# Patient Record
Sex: Male | Born: 1995 | Race: White | Hispanic: No | Marital: Single | Smoking: Current some day smoker
Health system: Southern US, Community
[De-identification: ages and names within clinical notes are randomized; demographics above are authoritative.]

## PROBLEM LIST (undated history)

## (undated) DIAGNOSIS — F909 Attention-deficit hyperactivity disorder, unspecified type: Secondary | ICD-10-CM

## (undated) DIAGNOSIS — L089 Local infection of the skin and subcutaneous tissue, unspecified: Secondary | ICD-10-CM

## (undated) DIAGNOSIS — Z9109 Other allergy status, other than to drugs and biological substances: Secondary | ICD-10-CM

## (undated) HISTORY — PX: LUNG LOBECTOMY: SHX167

## (undated) HISTORY — PX: OTHER SURGICAL HISTORY: SHX169

## (undated) HISTORY — PX: WISDOM TOOTH EXTRACTION: SHX21

---
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
## 2019-04-27 ENCOUNTER — Encounter (HOSPITAL_COMMUNITY): Payer: Self-pay

## 2019-04-27 ENCOUNTER — Ambulatory Visit (HOSPITAL_COMMUNITY)
Admission: EM | Admit: 2019-04-27 | Discharge: 2019-04-27 | Disposition: A | Discharge status: Home / Self Care | Payer: Medicaid Other | Attending: Family Medicine | Admitting: Family Medicine

## 2019-04-27 ENCOUNTER — Other Ambulatory Visit: Payer: Self-pay

## 2019-04-27 DIAGNOSIS — Z23 Encounter for immunization: Secondary | ICD-10-CM

## 2019-04-27 DIAGNOSIS — W25XXXA Contact with sharp glass, initial encounter: Secondary | ICD-10-CM | POA: Diagnosis not present

## 2019-04-27 DIAGNOSIS — S90852A Superficial foreign body, left foot, initial encounter: Secondary | ICD-10-CM | POA: Diagnosis not present

## 2019-04-27 MED ORDER — TETANUS-DIPHTH-ACELL PERTUSSIS 5-2.5-18.5 LF-MCG/0.5 IM SUSP
0.5000 mL | Freq: Once | INTRAMUSCULAR | Status: AC
  Administered 2019-04-27: 16:00:00 0.5 mL via INTRAMUSCULAR

## 2019-04-27 MED ORDER — TETANUS-DIPHTH-ACELL PERTUSSIS 5-2.5-18.5 LF-MCG/0.5 IM SUSP
INTRAMUSCULAR | Status: AC
  Filled 2019-04-27: qty 0.5

## 2019-04-27 NOTE — ED Triage Notes (Signed)
Patient presents to Urgent Care with complaints of getting glass stuck in his left heel since a few hours ago. Patient reports he tried to dig it out with his fingernails pta, clear object appears to be sticking out of heel area.

## 2019-04-27 NOTE — ED Notes (Signed)
Small piece of glass removed from left hell, gauze bandage with bacitracin placed on bleeding area.

## 2019-04-27 NOTE — Discharge Instructions (Signed)
Watch for any signs of infection.  Return if you get redness pus or increasing pain

## 2019-04-27 NOTE — ED Provider Notes (Signed)
[facility]    CSN: [id number] Arrival date & time: 04/27/19  1437      History   Chief Complaint Chief Complaint  Patient presents with  . Foreign Body in Foot    Glass    HPI Lissa HoardBradley S Jurek is a 23 y.o. male.   HPI patient stepped on a piece of glass at home.  He cannot get it out.  Bleeding.  He is here to have the foreign body removed  History reviewed. No pertinent past medical history.  There are no active problems to display for this patient.   Past Surgical History:  Procedure Laterality Date  . cyst removed from left lung    . LUNG LOBECTOMY     half of left lung removed       Home Medications    Prior to Admission medications   Not on File    Family History Family History  Problem Relation Age of Onset  . Healthy Mother   . Healthy Father     Social History Social History   Tobacco Use  . Smoking status: Current Some Day Smoker    Types: Cigars  . Smokeless tobacco: Never Used  Substance Use Topics  . Alcohol use: Yes    Alcohol/week: 4.0 standard drinks    Types: 4 Cans of beer per week  . Drug use: Yes    Types: Marijuana     Allergies   Ceftin   Review of Systems Review of Systems  Constitutional: Negative for chills and fever.  HENT: Negative for ear pain and sore throat.   Eyes: Negative for pain and visual disturbance.  Respiratory: Negative for cough and shortness of breath.   Cardiovascular: Negative for chest pain and palpitations.  Gastrointestinal: Negative for abdominal pain and vomiting.  Genitourinary: Negative for dysuria and hematuria.  Musculoskeletal: Negative for arthralgias and back pain.  Skin: Positive for wound. Negative for color change and rash.  Neurological: Negative for seizures and syncope.  All other systems reviewed and are negative.    Physical Exam Triage Vital Signs ED Triage Vitals  Enc Vitals Group     BP 04/27/19 1526 119/76     Pulse Rate 04/27/19 1526 95     Resp  04/27/19 1526 15     Temp 04/27/19 1526 97.8 F (36.6 C)     Temp Source 04/27/19 1526 Oral     SpO2 04/27/19 1526 99 %     Weight --      Height --      Head Circumference --      Peak Flow --      Pain Score 04/27/19 1525 0     Pain Loc --      Pain Edu? --      Excl. in GC? --    No data found.  Updated Vital Signs BP 119/76 (BP Location: Left Arm)   Pulse 95   Temp 97.8 F (36.6 C) (Oral)   Resp 15   SpO2 99%      Physical Exam Constitutional:      General: He is not in acute distress.    Appearance: He is well-developed.  HENT:     Head: Normocephalic and atraumatic.  Eyes:     Conjunctiva/sclera: Conjunctivae normal.     Pupils: Pupils are equal, round, and reactive to light.  Neck:     Musculoskeletal: Normal range of motion.  Cardiovascular:     Rate and Rhythm: Normal rate.  Pulmonary:     Effort: Pulmonary effort is normal. No respiratory distress.  Abdominal:     General: There is no distension.     Palpations: Abdomen is soft.  Musculoskeletal: Normal range of motion.  Skin:    General: Skin is warm and dry.     Comments: The bottom of the left heel there is a linear laceration right through the heel that is about 3 mm long.  The area is cleaned with soap and water.  Cleaned again with alcohol.  With splinter forceps the glass was removed.  Band-Aid is placed.  Wound care discussed  Neurological:     Mental Status: He is alert.      UC Treatments / Results  Labs (all labs ordered are listed, but only abnormal results are displayed) Labs Reviewed - No data to display  EKG   Radiology No results found.  Procedures Procedures (including critical care time)  Medications Ordered in UC Medications  Tdap (BOOSTRIX) injection 0.5 mL (0.5 mLs Intramuscular Given 04/27/19 1534)  Tdap (BOOSTRIX) 5-2.5-18.5 LF-MCG/0.5 injection (has no administration in time range)    Initial Impression / Assessment and Plan / UC Course  I have reviewed the  triage vital signs and the nursing notes.  Pertinent labs & imaging results that were available during my care of the patient were reviewed by me and considered in my medical decision making (see chart for details).      Final Clinical Impressions(s) / UC Diagnoses   Final diagnoses:  Foreign body in left foot, initial encounter     Discharge Instructions     Watch for any signs of infection.  Return if you get redness pus or increasing pain   ED Prescriptions    None     Controlled Substance Prescriptions [place] Controlled Substance Registry consulted? Not Applicable   Raylene Everts, MD 04/27/19 1547

## 2019-09-23 ENCOUNTER — Other Ambulatory Visit: Payer: Self-pay

## 2019-09-23 ENCOUNTER — Encounter (HOSPITAL_COMMUNITY): Payer: Self-pay | Admitting: Emergency Medicine

## 2019-09-23 ENCOUNTER — Ambulatory Visit (HOSPITAL_COMMUNITY)
Admission: EM | Admit: 2019-09-23 | Discharge: 2019-09-23 | Disposition: A | Discharge status: Home / Self Care | Payer: Medicaid Other | Attending: Emergency Medicine | Admitting: Emergency Medicine

## 2019-09-23 DIAGNOSIS — L089 Local infection of the skin and subcutaneous tissue, unspecified: Secondary | ICD-10-CM

## 2019-09-23 DIAGNOSIS — L729 Follicular cyst of the skin and subcutaneous tissue, unspecified: Secondary | ICD-10-CM | POA: Diagnosis not present

## 2019-09-23 MED ORDER — DOXYCYCLINE HYCLATE 100 MG PO CAPS: 100.0000 mg | ORAL_CAPSULE | Freq: Two times a day (BID) | ORAL | 0 refills | Status: DC

## 2019-09-23 MED ORDER — IBUPROFEN 600 MG PO TABS: 600.0000 mg | ORAL_TABLET | Freq: Four times a day (QID) | ORAL | 0 refills | Status: DC | PRN

## 2019-09-23 NOTE — Discharge Instructions (Signed)
Begin doxycycline twice daily for the next 10 days Use anti-inflammatories for pain/swelling. You may take up to 800 mg Ibuprofen every 8 hours with food. You may supplement Ibuprofen with Tylenol [phone] mg every 8 hours.  Warm compresses to area on ear  If becoming recurrent please follow-up with dermatology for further evaluation and discussion of possible removal of underlying cysts

## 2019-09-23 NOTE — ED Provider Notes (Signed)
[facility]    CSN: [id number] Arrival date & time: 09/23/19  1036      History   Chief Complaint Chief Complaint  Patient presents with  . Otalgia    HPI Kristopher Mccall is a 24 y.o. male no significant past medical history presenting today for evaluation of ear pain.  Patient states that over the past week he has had increased pain and swelling to the base of his right ear.  Has had similar symptoms on left, but more mild.  He notes that he has had a nervous tic to pull at his earlobes and he believes this may have triggered his issues.  Has felt a muffled sensation in his right ear.  Denies associated URI symptoms of congestion cough or sore throat.  Denies fevers.  HPI  History reviewed. No pertinent past medical history.  There are no problems to display for this patient.   Past Surgical History:  Procedure Laterality Date  . cyst removed from left lung    . LUNG LOBECTOMY     half of left lung removed       Home Medications    Prior to Admission medications   Medication Sig Start Date End Date Taking? Authorizing Provider  doxycycline (VIBRAMYCIN) 100 MG capsule Take 1 capsule (100 mg total) by mouth 2 (two) times daily for 10 days. 09/23/19 10/03/19  Gurleen Larrivee C, PA-C  ibuprofen (ADVIL) 600 MG tablet Take 1 tablet (600 mg total) by mouth every 6 (six) hours as needed. 09/23/19   Ziah Turvey, Junius Creamer, PA-C    Family History Family History  Problem Relation Age of Onset  . Healthy Mother   . Healthy Father     Social History Social History   Tobacco Use  . Smoking status: Current Some Day Smoker    Types: Cigars  . Smokeless tobacco: Never Used  Substance Use Topics  . Alcohol use: Yes    Alcohol/week: 4.0 standard drinks    Types: 4 Cans of beer per week  . Drug use: Yes    Types: Marijuana     Allergies   Ceftin   Review of Systems Review of Systems  Constitutional: Negative for activity change, appetite change, chills,  fatigue and fever.  HENT: Positive for ear pain. Negative for congestion, rhinorrhea, sinus pressure, sore throat and trouble swallowing.   Eyes: Negative for discharge and redness.  Respiratory: Negative for cough, chest tightness and shortness of breath.   Cardiovascular: Negative for chest pain.  Gastrointestinal: Negative for abdominal pain, diarrhea, nausea and vomiting.  Musculoskeletal: Negative for myalgias.  Skin: Negative for rash.  Neurological: Negative for dizziness, light-headedness and headaches.     Physical Exam Triage Vital Signs ED Triage Vitals [09/23/19 1055]  Enc Vitals Group     BP 128/80     Pulse Rate 99     Resp 18     Temp 98.3 F (36.8 C)     Temp Source Oral     SpO2 96 %     Weight      Height      Head Circumference      Peak Flow      Pain Score 5     Pain Loc      Pain Edu?      Excl. in GC?    No data found.  Updated Vital Signs BP 128/80 (BP Location: Right Arm)   Pulse 99   Temp 98.3 F (36.8 C) (  Oral)   Resp 18   SpO2 96%   Visual Acuity Right Eye Distance:   Left Eye Distance:   Bilateral Distance:    Right Eye Near:   Left Eye Near:    Bilateral Near:     Physical Exam Vitals and nursing note reviewed.  Constitutional:      Appearance: He is well-developed.     Comments: No acute distress  HENT:     Head: Normocephalic and atraumatic.     Ears:     Comments: Right ear: base of auricle/lobe with erythema and induration, tender to palpation, 2 dark punctate spots anteriorly and posteriorly ; questionable post auricular lymphadenopathy vs cyst related  Left external ear with palpable ball within lobe/ tissue connecting to head  Bilateral Canal normal, TM with good bony landmarks and cone of light    Nose: Nose normal.  Eyes:     Conjunctiva/sclera: Conjunctivae normal.  Cardiovascular:     Rate and Rhythm: Normal rate.  Pulmonary:     Effort: Pulmonary effort is normal. No respiratory distress.  Abdominal:      General: There is no distension.  Musculoskeletal:        General: Normal range of motion.     Cervical back: Neck supple.  Skin:    General: Skin is warm and dry.  Neurological:     Mental Status: He is alert and oriented to person, place, and time.      UC Treatments / Results  Labs (all labs ordered are listed, but only abnormal results are displayed) Labs Reviewed - No data to display  EKG   Radiology No results found.  Procedures Procedures (including critical care time)  Medications Ordered in UC Medications - No data to display  Initial Impression / Assessment and Plan / UC Course  I have reviewed the triage vital signs and the nursing notes.  Pertinent labs & imaging results that were available during my care of the patient were reviewed by me and considered in my medical decision making (see chart for details).     Appears to have multiple cysts to bilateral external ears, right appears inflamed/infected will initiate on doxycycline not take anti-inflammatories, apply warm compresses.  Given location and multiple cysts recommending follow-up with dermatology for possible removal.  No otitis media/externa currently.  Discussed strict return precautions. Patient verbalized understanding and is agreeable with plan.  Final Clinical Impressions(s) / UC Diagnoses   Final diagnoses:  Infected cyst of skin     Discharge Instructions     Begin doxycycline twice daily for the next 10 days Use anti-inflammatories for pain/swelling. You may take up to 800 mg Ibuprofen every 8 hours with food. You may supplement Ibuprofen with Tylenol [phone] mg every 8 hours.  Warm compresses to area on ear  If becoming recurrent please follow-up with dermatology for further evaluation and discussion of possible removal of underlying cysts   ED Prescriptions    Medication Sig Dispense Auth. Provider   doxycycline (VIBRAMYCIN) 100 MG capsule Take 1 capsule (100 mg total) by  mouth 2 (two) times daily for 10 days. 20 capsule Analina Filla C, PA-C   ibuprofen (ADVIL) 600 MG tablet Take 1 tablet (600 mg total) by mouth every 6 (six) hours as needed. 30 tablet Lonzo Saulter, Homer C, PA-C     PDMP not reviewed this encounter.   Janith Lima, [place] 09/23/19 1129

## 2019-09-23 NOTE — ED Triage Notes (Signed)
Pt here for left ear pain behind left ear

## 2019-09-29 ENCOUNTER — Other Ambulatory Visit: Payer: Self-pay

## 2019-09-29 ENCOUNTER — Ambulatory Visit (HOSPITAL_COMMUNITY)
Admission: EM | Admit: 2019-09-29 | Discharge: 2019-09-29 | Disposition: A | Discharge status: Home / Self Care | Payer: Medicaid Other | Attending: Emergency Medicine | Admitting: Emergency Medicine

## 2019-09-29 ENCOUNTER — Encounter (HOSPITAL_COMMUNITY): Payer: Self-pay

## 2019-09-29 DIAGNOSIS — L089 Local infection of the skin and subcutaneous tissue, unspecified: Secondary | ICD-10-CM | POA: Insufficient documentation

## 2019-09-29 DIAGNOSIS — L723 Sebaceous cyst: Secondary | ICD-10-CM | POA: Insufficient documentation

## 2019-09-29 MED ORDER — DOXYCYCLINE HYCLATE 100 MG PO CAPS: 100.0000 mg | ORAL_CAPSULE | Freq: Two times a day (BID) | ORAL | 0 refills | Status: AC

## 2019-09-29 MED ORDER — DOXYCYCLINE HYCLATE 100 MG PO CAPS: 100.0000 mg | ORAL_CAPSULE | Freq: Two times a day (BID) | ORAL | 0 refills | Status: DC

## 2019-09-29 NOTE — Discharge Instructions (Addendum)
continue ibuprofen, discontinue ice, start warm compresses.  5 more days of doxycycline.  You will need to follow-up with dermatology to have these definitively removed otherwise they will keep coming back.  Keep the Band-Aid on for several days.  Return if things seem to be getting worse.  Otherwise follow-up with one of the dermatologist listed above, follow-up with a primary care provider of your choice for routine care see list below Below is a list of primary care practices who are taking new patients for you to follow-up with.  [facility] internal medicine clinic Ground Floor - [facility], [address] [place], [place], [place] [address] [phone]  [facility] at [facility] [address] [place], [place] [address] [phone]  [facility] [address] [place], [place] [address] [phone]  [facility] [phone] [address] [place] [address]  [facility]: [address] [place] [address]  [phone]  [facility]: [address] [place] [address]   [phone]  [facility]: [address] [place] [address]  [phone]  [facility] : [address] [place] [address] [phone]  [facility]: [address] [place] [address] [phone]  [facility]: [address] [place] [address] [phone]  Dr. Oneal Grout [address] [facility] [place] [address]  [phone]  Dr. Jackie Plum, [facility]. [address] [place], [place] [address]  [phone]  Go to www.goodrx.com to look up your medications. This will give you a list of where you can find your  prescriptions at the most affordable prices. Or ask the pharmacist what the cash price is, or if they have any other discount programs available to help make your medication more affordable. This can be less expensive than what you would pay with insurance.  We will call you or reach out to you via MyChart if we need to change your antibiotics.  Your culture will take several days to come back.

## 2019-09-29 NOTE — ED Triage Notes (Signed)
Pt state he has a ear discomfort behind his ear lobes. (abscess ). Pt state this has been getting worst for the last month.

## 2019-09-29 NOTE — ED Provider Notes (Signed)
HPI  SUBJECTIVE:  Kristopher Mccall is a 24 y.o. male who presents with 2 to 3 days of a  painful erythematous mass of gradually increasing size inferior to his left earlobe.  He describes the pain as throbbing, present only with head movement and eating.  It lasts seconds.  He denies fevers, otorrhea, ear pain, dental pain.  States that his ears feel "clogged".  Patient was seen here 1/28, thought to have an infected cyst behind his right ear started on doxycycline and recommended to follow-up with dermatology.  States that he is taking the doxycycline mostly as prescribed and is taking 600 mg of ibuprofen once or twice a day.  He has also been applying ice which seems to help with the pain.  States that the right ear has started draining dark purulent material and the pain has resolved.  Primary concern today is left ear.  Symptoms are worse with head movement, eating.  He took ibuprofen within 4 to 6 hours of evaluation.  He has had sebaceous cysts in these areas before.  No contacts with MRSA.  No recent trauma.  Patient denies "messing with my ears" before the symptoms started.  No history of MRSA, diabetes.  PMD: None.  History reviewed. No pertinent past medical history.  Past Surgical History:  Procedure Laterality Date  . cyst removed from left lung    . LUNG LOBECTOMY     half of left lung removed    Family History  Problem Relation Age of Onset  . Healthy Mother   . Healthy Father     Social History   Tobacco Use  . Smoking status: Current Some Day Smoker    Types: Cigars  . Smokeless tobacco: Never Used  Substance Use Topics  . Alcohol use: Yes    Alcohol/week: 4.0 standard drinks    Types: 4 Cans of beer per week  . Drug use: Yes    Types: Marijuana    No current facility-administered medications for this encounter.  Current Outpatient Medications:  .  doxycycline (VIBRAMYCIN) 100 MG capsule, Take 1 capsule (100 mg total) by mouth 2 (two) times daily for 5 days.,  Disp: 10 capsule, Rfl: 0 .  ibuprofen (ADVIL) 600 MG tablet, Take 1 tablet (600 mg total) by mouth every 6 (six) hours as needed., Disp: 30 tablet, Rfl: 0  Allergies  Allergen Reactions  . Ceftin Diarrhea    Upset stomach     ROS  As noted in HPI.   Physical Exam  BP 126/89 (BP Location: Right Arm)   Pulse 85   Temp 98 F (36.7 C) (Oral)   Resp 16   Wt 70.3 kg   SpO2 98%   Constitutional: Well developed, well nourished, no acute distress Eyes:  EOMI, conjunctiva normal bilaterally HENT: Normocephalic, atraumatic,mucus membranes moist Left ear: Tender 1 x 1 cm area of erythema without induration inferior to the lobe.  No swelling behind the ear.  No tenderness of the mastoid.  Left external ear, external ear canal, TM normal.   Right ear: 2 x 1 cm nontender area of erythema with central fluctuance posterior inferior earlobe .  No expressible purulent drainage.  Positive comedone.  No mastoid tenderness.  External ear, external ear canal, TM normal.     Lymph: No surrounding lymphadenopathy bilaterally.  Respiratory: Normal inspiratory effort Cardiovascular: Normal rate GI: nondistended skin: No rash, skin intact Musculoskeletal: no deformities Neurologic: Alert & oriented x 3, no focal neuro deficits Psychiatric: Speech  and behavior appropriate   ED Course   Medications - No data to display  Orders Placed This Encounter  Procedures  . Aerobic Culture (superficial specimen)    Posterior right earlobe.    Standing Status:   Standing    Number of Occurrences:   1    No results found for this or any previous visit (from the past 24 hour(s)). No results found.  ED Clinical Impression  1. Infected sebaceous cyst      ED Assessment/Plan  Previous labs reviewed.  As noted in HPI.  Presentation consistent with infected sebaceous cyst.  Advised patient that he will need to have these surgically removed by dermatology for definitive treatment.  Given the  large area of fluctuance on the right side, will I&D this and send off for culture to ensure the correct antibiotic treatment.  He is to continue ibuprofen, discontinue ice, start warm compresses and will send home with 5 more days of doxycycline.  Advised that he will need to follow-up with dermatology, will give information for the local dermatology practices.  Will also give primary care referral list for routine care.  Procedure note: Cleaned area with alcohol.  Use a sterile 18-gauge needle make a single stab incision in the posterior right ear mass.  Expressed  copious amounts of purulent bloody material.  Sent this off for culture.  Applied bacitracin and a Band-Aid.  Patient tolerated procedure well.  Discussed labs,  MDM, treatment plan, and plan for follow-up with patient. Discussed sn/sx that should prompt return to the ED. patient agrees with plan.   Meds ordered this encounter  Medications  . DISCONTD: doxycycline (VIBRAMYCIN) 100 MG capsule    Sig: Take 1 capsule (100 mg total) by mouth 2 (two) times daily for 5 days.    Dispense:  20 capsule    Refill:  0  . doxycycline (VIBRAMYCIN) 100 MG capsule    Sig: Take 1 capsule (100 mg total) by mouth 2 (two) times daily for 5 days.    Dispense:  10 capsule    Refill:  0    *This clinic note was created using [employer]. Therefore, there may be occasional mistakes despite careful proofreading.   ?   Melynda Ripple, MD 09/29/19 [phone]

## 2019-10-02 LAB — AEROBIC CULTURE W GRAM STAIN (SUPERFICIAL SPECIMEN)

## 2019-10-02 LAB — AEROBIC CULTURE? (SUPERFICIAL SPECIMEN): Culture: NORMAL

## 2020-05-01 ENCOUNTER — Emergency Department (HOSPITAL_COMMUNITY): Payer: Medicaid Other

## 2020-05-01 ENCOUNTER — Emergency Department (HOSPITAL_COMMUNITY)
Admission: EM | Admit: 2020-05-01 | Discharge: 2020-05-01 | Disposition: A | Discharge status: Home / Self Care | Payer: Medicaid Other | Attending: Emergency Medicine | Admitting: Emergency Medicine

## 2020-05-01 DIAGNOSIS — Y939 Activity, unspecified: Secondary | ICD-10-CM | POA: Diagnosis not present

## 2020-05-01 DIAGNOSIS — Y92410 Unspecified street and highway as the place of occurrence of the external cause: Secondary | ICD-10-CM | POA: Diagnosis not present

## 2020-05-01 DIAGNOSIS — Z5321 Procedure and treatment not carried out due to patient leaving prior to being seen by health care provider: Secondary | ICD-10-CM | POA: Diagnosis not present

## 2020-05-01 DIAGNOSIS — M25551 Pain in right hip: Secondary | ICD-10-CM | POA: Diagnosis not present

## 2020-05-01 DIAGNOSIS — R519 Headache, unspecified: Secondary | ICD-10-CM | POA: Insufficient documentation

## 2020-05-01 DIAGNOSIS — M79661 Pain in right lower leg: Secondary | ICD-10-CM | POA: Insufficient documentation

## 2020-05-01 DIAGNOSIS — Y999 Unspecified external cause status: Secondary | ICD-10-CM | POA: Diagnosis not present

## 2020-05-01 DIAGNOSIS — M25511 Pain in right shoulder: Secondary | ICD-10-CM | POA: Diagnosis present

## 2020-05-01 NOTE — ED Notes (Signed)
Called pt no answer X3 

## 2020-05-01 NOTE — ED Notes (Signed)
Pt didn't answer for vital recheck
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
## 2020-05-01 NOTE — ED Notes (Signed)
Called pt for vitals no answer X2 

## 2020-05-02 ENCOUNTER — Ambulatory Visit (INDEPENDENT_AMBULATORY_CARE_PROVIDER_SITE_OTHER): Payer: Medicaid Other

## 2020-05-02 ENCOUNTER — Encounter (HOSPITAL_COMMUNITY): Payer: Self-pay

## 2020-05-02 ENCOUNTER — Other Ambulatory Visit: Payer: Self-pay

## 2020-05-02 ENCOUNTER — Ambulatory Visit (HOSPITAL_COMMUNITY)
Admission: EM | Admit: 2020-05-02 | Discharge: 2020-05-02 | Disposition: A | Discharge status: Home / Self Care | Payer: Medicaid Other | Attending: Physician Assistant | Admitting: Physician Assistant

## 2020-05-02 DIAGNOSIS — R0781 Pleurodynia: Secondary | ICD-10-CM | POA: Diagnosis not present

## 2020-05-02 DIAGNOSIS — M79661 Pain in right lower leg: Secondary | ICD-10-CM

## 2020-05-02 DIAGNOSIS — S20211A Contusion of right front wall of thorax, initial encounter: Secondary | ICD-10-CM

## 2020-05-02 DIAGNOSIS — M25571 Pain in right ankle and joints of right foot: Secondary | ICD-10-CM | POA: Diagnosis not present

## 2020-05-02 DIAGNOSIS — M25511 Pain in right shoulder: Secondary | ICD-10-CM | POA: Diagnosis not present

## 2020-05-02 DIAGNOSIS — M7918 Myalgia, other site: Secondary | ICD-10-CM

## 2020-05-02 DIAGNOSIS — M79671 Pain in right foot: Secondary | ICD-10-CM

## 2020-05-02 MED ORDER — IBUPROFEN 600 MG PO TABS: 600.0000 mg | ORAL_TABLET | Freq: Four times a day (QID) | ORAL | 0 refills | Status: AC | PRN

## 2020-05-02 MED ORDER — ACETAMINOPHEN 500 MG PO TABS: 1000.0000 mg | ORAL_TABLET | Freq: Three times a day (TID) | ORAL | 0 refills | Status: AC | PRN
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
## 2021-10-11 ENCOUNTER — Encounter (HOSPITAL_COMMUNITY): Payer: Self-pay

## 2021-10-11 ENCOUNTER — Ambulatory Visit (HOSPITAL_COMMUNITY)
Admission: EM | Admit: 2021-10-11 | Discharge: 2021-10-11 | Disposition: A | Discharge status: Home / Self Care | Payer: Medicaid Other | Attending: Family Medicine | Admitting: Family Medicine

## 2021-10-11 DIAGNOSIS — L723 Sebaceous cyst: Secondary | ICD-10-CM | POA: Diagnosis not present

## 2021-10-11 DIAGNOSIS — L089 Local infection of the skin and subcutaneous tissue, unspecified: Secondary | ICD-10-CM

## 2021-10-11 MED ORDER — DOXYCYCLINE HYCLATE 100 MG PO CAPS: 100.0000 mg | ORAL_CAPSULE | Freq: Two times a day (BID) | ORAL | 0 refills | Status: DC

## 2021-10-11 MED ORDER — HYDROCODONE-ACETAMINOPHEN 5-325 MG PO TABS
ORAL_TABLET | ORAL | Status: AC
  Filled 2021-10-11: qty 1

## 2021-10-11 MED ORDER — HYDROCODONE-ACETAMINOPHEN 5-325 MG PO TABS
1.0000 | ORAL_TABLET | Freq: Once | ORAL | Status: AC
  Administered 2021-10-11: 1 via ORAL

## 2021-10-11 NOTE — ED Triage Notes (Signed)
Pt presents with c/o R ear lobe pain.  Presents with swelling and redness.   States it has been swollen x 3 days.

## 2021-10-16 NOTE — ED Provider Notes (Signed)
[facility]   [id number] 10/11/21 Arrival Time: 1738  ASSESSMENT & PLAN:  1. Infected sebaceous cyst of skin     Incision and Drainage Procedure Note  Anesthesia: PainEaze topical spray  Procedure Details  The procedure, risks and complications have been discussed in detail (including, but not limited to pain and bleeding) with the patient.  The skin induration was prepped and draped in the usual fashion. After adequate local anesthesia, I&D with a #11 blade was performed on the right posterior earlobe with copious, purulent drainage.  EBL: minimal Drains: none Packing: n/a Condition: Tolerated procedure well Complications: none.  Meds ordered this encounter  Medications   HYDROcodone-acetaminophen (NORCO/VICODIN) 5-325 MG per tablet 1 tablet   doxycycline (VIBRAMYCIN) 100 MG capsule    Sig: Take 1 capsule (100 mg total) by mouth 2 (two) times daily.    Dispense:  14 capsule    Refill:  0   Wound care instructions discussed and given in written format. To return in 48 hours for wound check if he feels the need.  Finish all antibiotics. OTC analgesics as needed.  Reviewed expectations re: course of current medical issues. Questions answered. Outlined signs and symptoms indicating need for more acute intervention. Patient verbalized understanding. After Visit Summary given.   SUBJECTIVE:  Kristopher Mccall is a 26 y.o. male who presents with a possible infection of his R earlobe. Onset gradual, approximately a few days ago without active drainage and without active bleeding. Symptoms have gradually worsened since beginning; more painful. Fever: absent. OTC/home treatment: none.   OBJECTIVE:  Vitals:   10/11/21 1858  BP: 113/74  Pulse: 61  Resp: 17  Temp: 98.3 F (36.8 C)  TempSrc: Oral  SpO2: 95%     General appearance: alert; no distress R earlobe: approx 1.5 - 2 cm area of induration; tender to touch; no active drainage or  bleeding Psychological: alert and cooperative; normal mood and affect  Allergies  Allergen Reactions   Ceftin Diarrhea    Upset stomach    History reviewed. No pertinent past medical history. Social History   Socioeconomic History   Marital status: Single    Spouse name: Not on file   Number of children: Not on file   Years of education: Not on file   Highest education level: Not on file  Occupational History   Not on file  Tobacco Use   Smoking status: Some Days    Types: Cigars   Smokeless tobacco: Never  Vaping Use   Vaping Use: Never used  Substance and Sexual Activity   Alcohol use: Yes    Alcohol/week: 4.0 standard drinks    Types: 4 Cans of beer per week   Drug use: Yes    Types: Marijuana   Sexual activity: Yes  Other Topics Concern   Not on file  Social History Narrative   Not on file   Social Determinants of Health   Financial Resource Strain: Not on file  Food Insecurity: Not on file  Transportation Needs: Not on file  Physical Activity: Not on file  Stress: Not on file  Social Connections: Not on file   Family History  Problem Relation Age of Onset   Healthy Mother    Healthy Father    Past Surgical History:  Procedure Laterality Date   cyst removed from left lung     LUNG LOBECTOMY     half of left lung removed  Mardella Layman, MD 10/16/21 [phone]

## 2021-12-16 ENCOUNTER — Encounter (HOSPITAL_COMMUNITY): Payer: Self-pay

## 2021-12-16 ENCOUNTER — Ambulatory Visit (HOSPITAL_COMMUNITY)
Admission: EM | Admit: 2021-12-16 | Discharge: 2021-12-16 | Disposition: A | Discharge status: Home / Self Care | Payer: Medicaid Other | Attending: Internal Medicine | Admitting: Internal Medicine

## 2021-12-16 DIAGNOSIS — K047 Periapical abscess without sinus: Secondary | ICD-10-CM | POA: Diagnosis not present

## 2021-12-16 DIAGNOSIS — K0889 Other specified disorders of teeth and supporting structures: Secondary | ICD-10-CM

## 2021-12-16 MED ORDER — AMOXICILLIN-POT CLAVULANATE 875-125 MG PO TABS: 1.0000 | ORAL_TABLET | Freq: Two times a day (BID) | ORAL | 0 refills | Status: DC

## 2021-12-16 MED ORDER — LIDOCAINE VISCOUS HCL 2 % MT SOLN: 15.0000 mL | OROMUCOSAL | 0 refills | Status: DC | PRN

## 2021-12-16 NOTE — Discharge Instructions (Signed)
You have been prescribed an antibiotic and a lidocaine solution to help alleviate symptoms.  Please follow-up with dentist for further evaluation and management. ?

## 2021-12-16 NOTE — ED Triage Notes (Signed)
2-3 days of numbness and tingling with some facial swelling. Pt believes that he may have a few infected teeth. Dental pain is intermittent and mostly occurs when he bites down hard. N/t >pain. ?Has been taking otc pain meds with temporary relief. ?

## 2021-12-16 NOTE — ED Provider Notes (Signed)
?[facility] ? ? ? ?CSN: [id number] ?Arrival date & time: 12/16/21  1408 ? ? ?  ? ?History   ?Chief Complaint ?Chief Complaint  ?Patient presents with  ? Dental Pain  ? ? ?HPI ?Kristopher Mccall is a 26 y.o. male.  ? ?Patient presents with 2 to 3-day history of dental pain, swelling, tingling.  Patient reports that he has a history of bad dentition, and he saw a dentist approximately 1 week ago.  He only had a dental evaluation as his insurance would not cover for any procedures, extractions, medications, treatment.  Pain is mainly present in the right upper back teeth but is present throughout as well.  Patient denies any antibiotics were prescribed by dentist.  Pain mainly occurs with movement of the mouth and with eating.  Patient has been taking over-the-counter pain medication with minimal improvement. ? ? ?Dental Pain ? ?History reviewed. No pertinent past medical history. ? ?There are no problems to display for this patient. ? ? ?Past Surgical History:  ?Procedure Laterality Date  ? cyst removed from left lung    ? LUNG LOBECTOMY    ? half of left lung removed  ? ? ? ? ? ?Home Medications   ? ?Prior to Admission medications   ?Medication Sig Start Date End Date Taking? Authorizing Provider  ?amoxicillin-clavulanate (AUGMENTIN) 875-125 MG tablet Take 1 tablet by mouth every 12 (twelve) hours. 12/16/21  Yes Gustavus Bryant, FNP  ?lidocaine (XYLOCAINE) 2 % solution Use as directed 15 mLs in the mouth or throat as needed for mouth pain. Swish and spit 12/16/21  Yes Gustavus Bryant, FNP  ?acetaminophen (TYLENOL) 500 MG tablet Take 2 tablets (1,000 mg total) by mouth every 8 (eight) hours as needed. 05/02/20   Darr, Gerilyn Pilgrim, PA-C  ?doxycycline (VIBRAMYCIN) 100 MG capsule Take 1 capsule (100 mg total) by mouth 2 (two) times daily. 10/11/21   Mardella Layman, MD  ?ibuprofen (ADVIL) 600 MG tablet Take 1 tablet (600 mg total) by mouth every 6 (six) hours as needed. 05/02/20   Darr, Gerilyn Pilgrim, PA-C  ? ? ?Family History ?Family  History  ?Problem Relation Age of Onset  ? Healthy Mother   ? Healthy Father   ? ? ?Social History ?Social History  ? ?Tobacco Use  ? Smoking status: Some Days  ?  Types: Cigars  ? Smokeless tobacco: Never  ?Vaping Use  ? Vaping Use: Never used  ?Substance Use Topics  ? Alcohol use: Yes  ?  Alcohol/week: 4.0 standard drinks  ?  Types: 4 Cans of beer per week  ? Drug use: Yes  ?  Types: Marijuana  ? ? ? ?Allergies   ?Ceftin ? ? ?Review of Systems ?Review of Systems ?Per HPI ? ?Physical Exam ?Triage Vital Signs ?ED Triage Vitals  ?Enc Vitals Group  ?   BP 12/16/21 1509 111/72  ?   Pulse Rate 12/16/21 1509 68  ?   Resp 12/16/21 1509 18  ?   Temp 12/16/21 1509 98.2 ?F (36.8 ?C)  ?   Temp Source 12/16/21 1509 Oral  ?   SpO2 12/16/21 1509 98 %  ?   Weight --   ?   Height --   ?   Head Circumference --   ?   Peak Flow --   ?   Pain Score 12/16/21 1508 0  ?   Pain Loc --   ?   Pain Edu? --   ?   Excl. in GC? --   ? ?  No data found. ? ?Updated Vital Signs ?BP 111/72 (BP Location: Right Arm)   Pulse 68   Temp 98.2 ?F (36.8 ?C) (Oral)   Resp 18   SpO2 98%  ? ?Visual Acuity ?Right Eye Distance:   ?Left Eye Distance:   ?Bilateral Distance:   ? ?Right Eye Near:   ?Left Eye Near:    ?Bilateral Near:    ? ?Physical Exam ?Constitutional:   ?   General: He is not in acute distress. ?   Appearance: Normal appearance. He is not toxic-appearing or diaphoretic.  ?HENT:  ?   Head: Normocephalic and atraumatic.  ?   Mouth/Throat:  ?   Lips: Pink.  ?   Mouth: Mucous membranes are moist.  ?   Dentition: Abnormal dentition. Dental tenderness, gingival swelling and dental caries present.  ?   Comments: Broken tooth present to right upper back dentition.  Surrounding gingival swelling and erythema. ?Eyes:  ?   Extraocular Movements: Extraocular movements intact.  ?   Conjunctiva/sclera: Conjunctivae normal.  ?Pulmonary:  ?   Effort: Pulmonary effort is normal.  ?Neurological:  ?   General: No focal deficit present.  ?   Mental Status: He is  alert and oriented to person, place, and time. Mental status is at baseline.  ?Psychiatric:     ?   Mood and Affect: Mood normal.     ?   Behavior: Behavior normal.     ?   Thought Content: Thought content normal.     ?   Judgment: Judgment normal.  ? ? ? ?UC Treatments / Results  ?Labs ?(all labs ordered are listed, but only abnormal results are displayed) ?Labs Reviewed - No data to display ? ?EKG ? ? ?Radiology ?No results found. ? ?Procedures ?Procedures (including critical care time) ? ?Medications Ordered in UC ?Medications - No data to display ? ?Initial Impression / Assessment and Plan / UC Course  ?I have reviewed the triage vital signs and the nursing notes. ? ?Pertinent labs & imaging results that were available during my care of the patient were reviewed by me and considered in my medical decision making (see chart for details). ? ?  ? ?Will treat dental infection with Augmentin antibiotic.  Viscous lidocaine also prescribed to help alleviate discomfort.  Discussed supportive care and symptom management with patient.  Patient was advised that he will need to follow-up with dentist for further evaluation and management to resolve issue.  Discussed strict return precautions.  Patient verbalized understanding and was agreeable with plan. ?Final Clinical Impressions(s) / UC Diagnoses  ? ?Final diagnoses:  ?Pain, dental  ?Dental infection  ? ? ? ?Discharge Instructions   ? ?  ?You have been prescribed an antibiotic and a lidocaine solution to help alleviate symptoms.  Please follow-up with dentist for further evaluation and management. ? ? ? ?ED Prescriptions   ? ? Medication Sig Dispense Auth. Provider  ? amoxicillin-clavulanate (AUGMENTIN) 875-125 MG tablet Take 1 tablet by mouth every 12 (twelve) hours. 14 tablet Gustavus Bryant, [place]  ? lidocaine (XYLOCAINE) 2 % solution Use as directed 15 mLs in the mouth or throat as needed for mouth pain. Swish and spit 100 mL Gustavus Bryant, [place]  ? ?  ? ?PDMP not  reviewed this encounter. ?  ?Gustavus Bryant, [place] ?12/16/21 1529 ? ?

## 2022-08-26 HISTORY — PX: CYST EXCISION: SHX5701

## 2022-10-28 ENCOUNTER — Ambulatory Visit (HOSPITAL_COMMUNITY)
Admission: EM | Admit: 2022-10-28 | Discharge: 2022-10-28 | Disposition: A | Discharge status: Home / Self Care | Payer: Medicaid Other | Attending: Physician Assistant | Admitting: Physician Assistant

## 2022-10-28 ENCOUNTER — Other Ambulatory Visit: Payer: Self-pay

## 2022-10-28 ENCOUNTER — Encounter (HOSPITAL_COMMUNITY): Payer: Self-pay | Admitting: *Deleted

## 2022-10-28 DIAGNOSIS — H6002 Abscess of left external ear: Secondary | ICD-10-CM

## 2022-10-28 MED ORDER — HYDROCODONE-ACETAMINOPHEN 5-325 MG PO TABS
1.0000 | ORAL_TABLET | Freq: Once | ORAL | Status: AC
  Administered 2022-10-28: 1 via ORAL

## 2022-10-28 MED ORDER — AMOXICILLIN-POT CLAVULANATE 875-125 MG PO TABS: 1.0000 | ORAL_TABLET | Freq: Two times a day (BID) | ORAL | 0 refills | Status: DC

## 2022-10-28 MED ORDER — HYDROCODONE-ACETAMINOPHEN 5-325 MG PO TABS
ORAL_TABLET | ORAL | Status: AC
  Filled 2022-10-28: qty 1

## 2022-10-28 NOTE — ED Provider Notes (Signed)
[address]    CSN: [id number] Arrival date & time: 10/28/22  1758      History   Chief Complaint Chief Complaint  Patient presents with   ear lobe swelling    HPI Kristopher Mccall is a 27 y.o. male.   Patient presents today with a 1 day history of swelling and pain in his left earlobe.  Reports that throughout much of his life he has had cyst in his earlobes that will sometimes become infected.  He has not seen an ENT or had these removed in the past.  He has had them lanced and believes that that might be necessary today based on his symptoms.  Pain is rated 6 on a 0-10 pain scale, described as aching, no aggravating relieving factors identified.  He denies history of recurrent skin infections or MRSA.  Denies history of diabetes or immunosuppression.  Denies any recent antibiotics.  He has tried over-the-counter Tylenol ibuprofen without improvement of symptoms.    History reviewed. No pertinent past medical history.  There are no problems to display for this patient.   Past Surgical History:  Procedure Laterality Date   cyst removed from left lung     LUNG LOBECTOMY     half of left lung removed       Home Medications    Prior to Admission medications   Medication Sig Start Date End Date Taking? Authorizing Provider  amoxicillin-clavulanate (AUGMENTIN) 875-125 MG tablet Take 1 tablet by mouth every 12 (twelve) hours. 10/28/22  Yes Soraiya Ahner, Derry Skill, PA-C  acetaminophen (TYLENOL) 500 MG tablet Take 2 tablets (1,000 mg total) by mouth every 8 (eight) hours as needed. 05/02/20   Darr, Edison Nasuti, PA-C  ibuprofen (ADVIL) 600 MG tablet Take 1 tablet (600 mg total) by mouth every 6 (six) hours as needed. 05/02/20   Darr, Edison Nasuti, PA-C    Family History Family History  Problem Relation Age of Onset   Healthy Mother    Healthy Father     Social History Social History   Tobacco Use   Smoking status: Some Days    Types: Cigars   Smokeless tobacco: Never  Vaping Use    Vaping Use: Never used  Substance Use Topics   Alcohol use: Yes    Alcohol/week: 4.0 standard drinks of alcohol    Types: 4 Cans of beer per week   Drug use: Yes    Types: Marijuana     Allergies   Ceftin and Cefuroxime axetil   Review of Systems Review of Systems  Constitutional:  Positive for activity change. Negative for appetite change, fatigue and fever.  Gastrointestinal:  Negative for abdominal pain, diarrhea, nausea and vomiting.  Skin:  Positive for color change and wound.  Neurological:  Negative for weakness and numbness.     Physical Exam Triage Vital Signs ED Triage Vitals  Enc Vitals Group     BP 10/28/22 1928 125/83     Pulse Rate 10/28/22 1928 91     Resp 10/28/22 1928 18     Temp 10/28/22 1928 98 F (36.7 C)     Temp src --      SpO2 10/28/22 1928 98 %     Weight --      Height --      Head Circumference --      Peak Flow --      Pain Score 10/28/22 1925 6     Pain Loc --      Pain  Edu? --      Excl. in [place]? --    No data found.  Updated Vital Signs BP 125/83   Pulse 91   Temp 98 F (36.7 C)   Resp 18   SpO2 98%   Visual Acuity Right Eye Distance:   Left Eye Distance:   Bilateral Distance:    Right Eye Near:   Left Eye Near:    Bilateral Near:     Physical Exam Vitals reviewed.  Constitutional:      General: He is awake.     Appearance: Normal appearance. He is well-developed. He is not ill-appearing.     Comments: Very pleasant male appears stated age in no acute distress sitting comfortably in exam room  HENT:     Head: Normocephalic and atraumatic.     Right Ear: Tympanic membrane, ear canal and external ear normal. Tympanic membrane is not erythematous or bulging.     Left Ear: Tympanic membrane, ear canal and external ear normal. Tympanic membrane is not erythematous or bulging.     Ears:     Comments: Significant erythema and swelling of left earlobe with 2 cm x 3 cm fluctuant nodule posterior earlobe    Nose: Nose  normal.     Mouth/Throat:     Pharynx: Uvula midline. No oropharyngeal exudate or posterior oropharyngeal erythema.  Cardiovascular:     Rate and Rhythm: Normal rate and regular rhythm.     Heart sounds: Normal heart sounds, S1 normal and S2 normal. No murmur heard. Pulmonary:     Effort: Pulmonary effort is normal. No accessory muscle usage or respiratory distress.     Breath sounds: Normal breath sounds. No stridor. No wheezing, rhonchi or rales.     Comments: Clear to auscultation bilaterally Abdominal:     General: Bowel sounds are normal.     Palpations: Abdomen is soft.     Tenderness: There is no abdominal tenderness.  Neurological:     Mental Status: He is alert.  Psychiatric:        Behavior: Behavior is cooperative.      UC Treatments / Results  Labs (all labs ordered are listed, but only abnormal results are displayed) Labs Reviewed - No data to display  EKG   Radiology No results found.  Procedures Incision and Drainage  Date/Time: 10/28/2022 7:50 PM  Performed by: Terrilee Croak, PA-C Authorized by: Terrilee Croak, PA-C   Consent:    Consent obtained:  Verbal   Consent given by:  Patient   Risks discussed:  Bleeding, incomplete drainage and infection   Alternatives discussed:  Observation and referral Universal protocol:    Procedure explained and questions answered to patient or proxy's satisfaction: yes   Location:    Type:  Abscess   Size:  2cm x 3 cm   Location:  Head   Head location:  L external ear Pre-procedure details:    Skin preparation:  Chlorhexidine with alcohol Sedation:    Sedation type:  None Anesthesia:    Anesthesia method:  Topical application   Topical anesthetic:  Lidocaine gel Procedure type:    Complexity:  Complex Procedure details:    Ultrasound guidance: no     Needle aspiration: no     Incision types:  Stab incision   Incision depth:  Dermal   Wound management:  Irrigated with saline   Drainage:  Bloody and  purulent   Drainage amount:  Moderate   Wound treatment:  Wound left  open Post-procedure details:    Procedure completion:  Tolerated  (including critical care time)  Medications Ordered in UC Medications  HYDROcodone-acetaminophen (NORCO/VICODIN) 5-325 MG per tablet 1 tablet (1 tablet Oral Given 10/28/22 2007)    Initial Impression / Assessment and Plan / UC Course  I have reviewed the triage vital signs and the nursing notes.  Pertinent labs & imaging results that were available during my care of the patient were reviewed by me and considered in my medical decision making (see chart for details).     Lesion successfully drained in clinic with improvement of symptoms.  See procedure note above.  He continues to have pain and so was given 1 dose of hydrocodone.  He does not drive (does not have a license) and so we will have someone take him home.  Will start Augmentin twice daily to cover for infection.  Recommended that he use warm compresses and keep this area clean.  Given recurrent symptoms recommended that he follow-up with ENT and was given contact information for local provider.  Discussed that if he has any enlarging lesion, increasing pain, fever, nausea, vomiting he needs to be seen immediately.  Strict return precautions given.  Work excuse note provided.  Final Clinical Impressions(s) / UC Diagnoses   Final diagnoses:  Abscess of left earlobe     Discharge Instructions      Take Augmentin twice daily for 7 days.  Keep area clean with soap and water.  Follow-up with ENT.  If you have any worsening symptoms including enlarging lesion, increasing pain, fever, nausea, vomiting you need to be seen immediately.     ED Prescriptions     Medication Sig Dispense Auth. Provider   amoxicillin-clavulanate (AUGMENTIN) 875-125 MG tablet Take 1 tablet by mouth every 12 (twelve) hours. 14 tablet Saralyn Willison, Derry Skill, PA-C      PDMP not reviewed this encounter.   Terrilee Croak,  PA-C 10/28/22 2013

## 2022-10-28 NOTE — ED Triage Notes (Signed)
Pt presents today with swelling to Lt ear lobe.

## 2022-10-28 NOTE — Discharge Instructions (Addendum)
Take Augmentin twice daily for 7 days.  Keep area clean with soap and water.  Follow-up with ENT.  If you have any worsening symptoms including enlarging lesion, increasing pain, fever, nausea, vomiting you need to be seen immediately.

## 2022-12-25 DIAGNOSIS — L729 Follicular cyst of the skin and subcutaneous tissue, unspecified: Secondary | ICD-10-CM | POA: Diagnosis not present

## 2023-01-07 NOTE — H&P (Signed)
HPI:  Kristopher Mccall is a 27 y.o. male who presents as a new Patient.  Referring Provider: No ref. provider found  Chief complaint: Ear skin cyst.  HPI: Bilateral earlobe and earlobe area skin cyst that get infected periodically. He gets them lanced periodically. Otherwise in good health. Not a tobacco user.  PMH/Meds/All/SocHx/FamHx/ROS:  History reviewed. No pertinent past medical history.  History reviewed. No pertinent surgical history.  No family history of bleeding disorders, wound healing problems or difficulty with anesthesia.    No current outpatient medications on file.  A complete ROS was performed with pertinent positives/negatives noted in the HPI. The remainder of the ROS are negative.   Physical Exam:  There were no vitals taken for this visit.  General: Healthy and alert, in no distress, breathing easily. Normal affect. In a pleasant mood. Head: Normocephalic, atraumatic. No masses, or scars. Eyes: Pupils are equal, and reactive to light. Vision is grossly intact. No spontaneous or gaze nystagmus. Ears: Ear canals are clear. Tympanic membranes are intact, with normal landmarks and the middle ears are clear and healthy. Hearing: Grossly normal. Nose: Nasal cavities are clear with healthy mucosa, no polyps or exudate. Airways are patent. Face: Multiple small subcentimeter cystic masses involving the right earlobe and surrounding soft tissue and the left earlobe attachment and adjacent skin. Masses or scars, facial nerve function is symmetric. Oral Cavity: No mucosal abnormalities are noted. Tongue with normal mobility. Dentition appears healthy. Oropharynx: Tonsils are symmetric. There are no mucosal masses identified. Tongue base appears normal and healthy. Larynx/Hypopharynx: deferred Chest: Deferred Neck: No palpable masses, no cervical adenopathy, no thyroid nodules or enlargement. Neuro: Cranial nerves II-XII with normal function. Balance: Normal  gate. Other findings: none.  Independent Review of Additional Tests or Records: none  Procedures: none  Impression & Plans: Multiple small epidermoid cysts based around the earlobes on both sides. Recommend excision of these all at the same time under local anesthesia with IV sedation at the outpatient center. He understands and agrees.

## 2023-01-08 ENCOUNTER — Other Ambulatory Visit: Payer: Self-pay

## 2023-01-08 ENCOUNTER — Encounter (HOSPITAL_BASED_OUTPATIENT_CLINIC_OR_DEPARTMENT_OTHER): Payer: Self-pay | Admitting: Otolaryngology

## 2023-01-15 ENCOUNTER — Ambulatory Visit (HOSPITAL_BASED_OUTPATIENT_CLINIC_OR_DEPARTMENT_OTHER): Admission: RE | Admit: 2023-01-15 | Payer: Medicaid Other | Source: Home / Self Care | Admitting: Otolaryngology

## 2023-01-15 HISTORY — DX: Local infection of the skin and subcutaneous tissue, unspecified: L08.9

## 2023-01-15 HISTORY — DX: Attention-deficit hyperactivity disorder, unspecified type: F90.9

## 2023-01-15 HISTORY — DX: Other allergy status, other than to drugs and biological substances: Z91.09

## 2023-01-15 SURGERY — EXCISION, CYST, EAR
Anesthesia: Monitor Anesthesia Care | Laterality: Bilateral

## 2023-03-28 ENCOUNTER — Ambulatory Visit (HOSPITAL_COMMUNITY)
Admission: EM | Admit: 2023-03-28 | Discharge: 2023-03-28 | Disposition: A | Discharge status: Home / Self Care | Payer: Medicaid Other | Attending: Emergency Medicine | Admitting: Emergency Medicine

## 2023-03-28 ENCOUNTER — Encounter (HOSPITAL_COMMUNITY): Payer: Self-pay

## 2023-03-28 DIAGNOSIS — H6002 Abscess of left external ear: Secondary | ICD-10-CM

## 2023-03-28 MED ORDER — BACITRACIN ZINC 500 UNIT/GM EX OINT
TOPICAL_OINTMENT | CUTANEOUS | Status: AC
  Filled 2023-03-28: qty 0.9

## 2023-03-28 MED ORDER — AMOXICILLIN-POT CLAVULANATE 875-125 MG PO TABS: 1.0000 | ORAL_TABLET | Freq: Two times a day (BID) | ORAL | 0 refills | Status: DC

## 2023-03-28 MED ORDER — LIDOCAINE-EPINEPHRINE-TETRACAINE (LET) TOPICAL GEL
3.0000 mL | Freq: Once | TOPICAL | Status: AC
  Administered 2023-03-28: 3 mL via TOPICAL

## 2023-03-28 MED ORDER — LIDOCAINE-EPINEPHRINE-TETRACAINE (LET) TOPICAL GEL
TOPICAL | Status: AC
  Filled 2023-03-28: qty 3

## 2023-03-28 NOTE — Discharge Instructions (Signed)
Follow with Dr. Pollyann Kennedy from ENT as scheduled to have your epidermoid cysts removed. Finish all of the antibiotics, even if you are feeling better. Keep wound clean and covered with antibiotic ointment and a bandage until it heals.   Use tylenol or ibuprofen as directed on the package for pain.

## 2023-03-28 NOTE — ED Provider Notes (Signed)
[facility]    CSN: [id number] Arrival date & time: 03/28/23  1140      History   Chief Complaint No chief complaint on file.   HPI Kristopher Mccall is a 27 y.o. male. He reports repeated earlobe abscesses/epidermoid cyst. Is scheduled for excision 04/04/23 with ENT Dr. Pollyann Kennedy. Pt has not called Dr. Lucky Rathke office about current abscess. 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 Pt believes it is infected again. Last I&D for this same problem was last March.   HPI  Past Medical History:  Diagnosis Date   ADHD    Environmental allergies    Infected sebaceous cyst of skin     There are no problems to display for this patient.   Past Surgical History:  Procedure Laterality Date   cyst removed from left lung     LUNG LOBECTOMY     half of left lung removed   WISDOM TOOTH EXTRACTION         Home Medications    Prior to Admission medications   Medication Sig Start Date End Date Taking? Authorizing Provider  amoxicillin-clavulanate (AUGMENTIN) 875-125 MG tablet Take 1 tablet by mouth 2 (two) times daily. 03/28/23  Yes Cathlyn Parsons, NP  acetaminophen (TYLENOL) 500 MG tablet Take 2 tablets (1,000 mg total) by mouth every 8 (eight) hours as needed. 05/02/20   Darr, Gerilyn Pilgrim, PA-C  ibuprofen (ADVIL) 600 MG tablet Take 1 tablet (600 mg total) by mouth every 6 (six) hours as needed. 05/02/20   Darr, Gerilyn Pilgrim, PA-C    Family History Family History  Problem Relation Age of Onset   Healthy Mother    Healthy Father     Social History Social History   Tobacco Use   Smoking status: Some Days    Types: Cigars   Smokeless tobacco: Never  Vaping Use   Vaping status: Never Used  Substance Use Topics   Alcohol use: Yes    Alcohol/week: 4.0 standard drinks of alcohol    Types: 4 Cans of beer per week   Drug use: Yes    Types: Marijuana     Allergies   Ceftin and Cefuroxime axetil   Review of Systems Review of  Systems   Physical Exam Triage Vital Signs ED Triage Vitals  Encounter Vitals Group     BP 03/28/23 1224 129/81     Systolic BP Percentile --      Diastolic BP Percentile --      Pulse Rate 03/28/23 1224 90     Resp 03/28/23 1224 14     Temp 03/28/23 1224 98.3 F (36.8 C)     Temp Source 03/28/23 1224 Oral     SpO2 03/28/23 1224 97 %     Weight --      Height --      Head Circumference --      Peak Flow --      Pain Score 03/28/23 1226 8     Pain Loc --      Pain Education --      Exclude from Growth Chart --    No data found.  Updated Vital Signs BP 129/81 (BP Location: Left Arm)   Pulse 90   Temp 98.3 F (36.8 C) (Oral)   Resp 14   SpO2 97%   Visual Acuity Right Eye Distance:   Left Eye Distance:   Bilateral Distance:    Right Eye Near:   Left Eye  Near:    Bilateral Near:     Physical Exam Constitutional:      Appearance: Normal appearance. He is not ill-appearing.  HENT:     Head:   Pulmonary:     Effort: Pulmonary effort is normal.  Neurological:     Mental Status: He is alert.      UC Treatments / Results  Labs (all labs ordered are listed, but only abnormal results are displayed) Labs Reviewed - No data to display  EKG   Radiology No results found.  Procedures Incision and Drainage  Date/Time: 03/28/2023 2:29 PM  Performed by: Cathlyn Parsons, NP Authorized by: Cathlyn Parsons, NP   Consent:    Consent obtained:  Verbal   Consent given by:  Patient   Risks, benefits, and alternatives were discussed: yes     Risks discussed:  Pain   Alternatives discussed:  Observation Universal protocol:    Patient identity confirmed:  Verbally with patient Location:    Type:  Abscess   Location:  Head   Head/neck location: L head posterior to L earlobe - it is not the earlobe itself. Pre-procedure details:    Skin preparation:  Chlorhexidine Sedation:    Sedation type:  None Anesthesia:    Anesthesia method:  Topical application    Topical anesthetic:  LET (LET was not applied to earlobe but to area of head posterior to earlobe) Procedure type:    Complexity:  Simple Procedure details:    Incision types:  Stab incision   Incision depth:  Dermal   Drainage:  Purulent and bloody   Drainage amount:  Moderate   Wound treatment:  Wound left open   Packing materials:  None Post-procedure details:    Procedure completion:  Tolerated well, no immediate complications  (including critical care time)  Medications Ordered in UC Medications  lidocaine-EPINEPHrine-tetracaine (LET) topical gel (3 mLs Topical Given 03/28/23 1400)    Initial Impression / Assessment and Plan / UC Course  I have reviewed the triage vital signs and the nursing notes.  Pertinent labs & imaging results that were available during my care of the patient were reviewed by me and considered in my medical decision making (see chart for details).    Pt contacted Dr. Lucky Rathke office for guidance and I spoke to him briefly. No contraindication to I&D here today at [facility].   Rx augmentin. Pt declines offer of pain medicine -feels better now that abscess is drained. Will cont to f/u with Dr. Pollyann Kennedy as scheduled.   Final Clinical Impressions(s) / UC Diagnoses   Final diagnoses:  Abscess of left earlobe     Discharge Instructions      Follow with Dr. Pollyann Kennedy from ENT as scheduled to have your epidermoid cysts removed. Finish all of the antibiotics, even if you are feeling better. Keep wound clean and covered with antibiotic ointment and a bandage until it heals.   Use tylenol or ibuprofen as directed on the package for pain.    ED Prescriptions     Medication Sig Dispense Auth. Provider   amoxicillin-clavulanate (AUGMENTIN) 875-125 MG tablet Take 1 tablet by mouth 2 (two) times daily. 14 tablet Cathlyn Parsons, NP      PDMP not reviewed this encounter.   Cathlyn Parsons, NP 03/28/23 1435

## 2023-03-28 NOTE — ED Triage Notes (Signed)
Patient reports that he has an abscess to the left earlobe and has a scheduled surgery, but can't take the pain and pressure.

## 2023-04-04 DIAGNOSIS — L729 Follicular cyst of the skin and subcutaneous tissue, unspecified: Secondary | ICD-10-CM | POA: Diagnosis not present

## 2023-04-04 DIAGNOSIS — L720 Epidermal cyst: Secondary | ICD-10-CM | POA: Diagnosis not present

## 2023-09-24 ENCOUNTER — Encounter (HOSPITAL_COMMUNITY): Payer: Self-pay

## 2023-09-24 ENCOUNTER — Ambulatory Visit (HOSPITAL_COMMUNITY)
Admission: EM | Admit: 2023-09-24 | Discharge: 2023-09-24 | Disposition: A | Discharge status: Home / Self Care | Payer: Medicaid Other | Attending: Family Medicine | Admitting: Family Medicine

## 2023-09-24 ENCOUNTER — Ambulatory Visit (INDEPENDENT_AMBULATORY_CARE_PROVIDER_SITE_OTHER): Payer: Medicaid Other

## 2023-09-24 DIAGNOSIS — R051 Acute cough: Secondary | ICD-10-CM | POA: Diagnosis not present

## 2023-09-24 DIAGNOSIS — R079 Chest pain, unspecified: Secondary | ICD-10-CM | POA: Diagnosis not present

## 2023-09-24 DIAGNOSIS — R059 Cough, unspecified: Secondary | ICD-10-CM | POA: Diagnosis not present

## 2023-09-24 MED ORDER — PROMETHAZINE-DM 6.25-15 MG/5ML PO SYRP: 5.0000 mL | ORAL_SOLUTION | Freq: Four times a day (QID) | ORAL | 0 refills | Status: DC | PRN

## 2023-09-24 MED ORDER — DOXYCYCLINE HYCLATE 100 MG PO CAPS: 100.0000 mg | ORAL_CAPSULE | Freq: Two times a day (BID) | ORAL | 0 refills | Status: DC

## 2023-09-24 NOTE — ED Triage Notes (Addendum)
He has been sick for a week including vomiting 1x, body aches, sore throat, nasal congestion, and loss of appetite. Most of the symptoms have resolved expect for his cough and chest soreness upon coughing.  Start Date: 09/16/2023  Home Interventions: Mucinex, Ibuprofen, and Benadryl with little relief

## 2023-09-25 NOTE — ED Provider Notes (Signed)
[facility]   [id number] 09/24/23 Arrival Time: 1812  ASSESSMENT & PLAN:  1. Acute cough    I have personally viewed and independently interpreted the imaging studies ordered this visit. CXR: ques small developing infiltrate over LLL.  Agree with f/u plan. DG Chest 2 View Result Date: 09/24/2023 CLINICAL DATA:  Cough and chest pain EXAM: CHEST - 2 VIEW COMPARISON:  Chest x-ray 10/05/2011 FINDINGS: There is blunting of the left costophrenic angle, unchanged. Questionable focal density seen overlying the posterior left eighth rib seen only on the frontal view. There is no pleural effusion or pneumothorax visualized. The cardiomediastinal silhouette is within normal limits. No acute fractures are seen. IMPRESSION: Questionable focal density seen overlying the posterior left eighth rib seen only on the frontal view. This may be artifactual. Recommend follow-up chest x-ray in 4-6 weeks to ensure resolution. Alternatively, a follow-up chest CT can be performed if clinically warranted. Electronically Signed   By: Darliss Cheney M.D.   On: 09/24/2023 21:04   Will tx for possibility of PNA. Begin: Meds ordered this encounter  Medications   promethazine-dextromethorphan (PROMETHAZINE-DM) 6.25-15 MG/5ML syrup    Sig: Take 5 mLs by mouth 4 (four) times daily as needed for cough.    Dispense:  118 mL    Refill:  0   doxycycline (VIBRAMYCIN) 100 MG capsule    Sig: Take 1 capsule (100 mg total) by mouth 2 (two) times daily.    Dispense:  14 capsule    Refill:  0     Follow-up Information     [facility].   Specialty: Urgent Care Why: If worsening or failing to improve as anticipated. Contact information: [address] [place] [address] [phone]                Reviewed expectations re: course of current medical issues. Questions answered. Outlined signs and symptoms indicating need for more acute  intervention. Understanding verbalized. After Visit Summary given.   SUBJECTIVE: History from: Patient. Kristopher Mccall is a 28 y.o. male. He has been sick for a week including vomiting 1x, body aches, sore throat, nasal congestion, and loss of appetite. Most of the symptoms have resolved expect for his cough and chest soreness upon coughing.  Start Date: 09/16/2023  Home Interventions: Mucinex, Ibuprofen, and Benadryl with little relief   Denies: difficulty breathing and CP (other than chest "soreness" as described above only when coughing). Normal PO intake without n/v/d.  OBJECTIVE:  Vitals:   09/24/23 1942  BP: 102/63  Pulse: (!) 112  Resp: 18  Temp: 99.6 F (37.6 C)  TempSrc: Oral  SpO2: 92%    Recheck P: 102 General appearance: alert; no distress Eyes: PERRLA; EOMI; conjunctiva normal HENT: [place]; AT; with mild nasal congestion Neck: supple  Lungs: speaks full sentences without difficulty; unlabored; CTAB Extremities: no edema Skin: warm and dry Neurologic: normal gait Psychological: alert and cooperative; normal mood and affect  Allergies  Allergen Reactions   Ceftin Diarrhea    Upset stomach   Cefuroxime Axetil Diarrhea    Upset stomach    Past Medical History:  Diagnosis Date   ADHD    Environmental allergies    Infected sebaceous cyst of skin    Social History   Socioeconomic History   Marital status: Single    Spouse name: Not on file   Number of children: Not on file   Years of education: Not on file   Highest  education level: Not on file  Occupational History   Not on file  Tobacco Use   Smoking status: Some Days    Types: Cigars   Smokeless tobacco: Never  Vaping Use   Vaping status: Never Used  Substance and Sexual Activity   Alcohol use: Yes    Alcohol/week: 4.0 standard drinks of alcohol    Types: 4 Cans of beer per week   Drug use: Yes    Types: Marijuana   Sexual activity: Yes  Other Topics Concern   Not on file  Social  History Narrative   Not on file   Social Drivers of Health   Financial Resource Strain: Not on file  Food Insecurity: Low Risk  (04/17/2023)   Received from [facility]   Hunger Vital Sign    Worried About Running Out of Food in the Last Year: Never true    Ran Out of Food in the Last Year: Never true  Transportation Needs: No Transportation Needs (04/17/2023)   Received from [facility]    In the past 12 months, has lack of reliable transportation kept you from medical appointments, meetings, work or from getting things needed for daily living? : No  Physical Activity: Not on file  Stress: Not on file  Social Connections: Not on file  Intimate Partner Violence: Not on file   Family History  Problem Relation Age of Onset   Healthy Mother    Healthy Father    Past Surgical History:  Procedure Laterality Date   CYST EXCISION  2024   Ears   cyst removed from left lung     LUNG LOBECTOMY     half of left lung removed   WISDOM TOOTH EXTRACTION       Mardella Layman, MD 09/25/23 0930

## 2024-01-21 ENCOUNTER — Ambulatory Visit (HOSPITAL_COMMUNITY)
Admission: EM | Admit: 2024-01-21 | Discharge: 2024-01-21 | Disposition: A | Discharge status: Home / Self Care | Attending: Internal Medicine | Admitting: Internal Medicine

## 2024-01-21 ENCOUNTER — Encounter (HOSPITAL_COMMUNITY): Payer: Self-pay

## 2024-01-21 DIAGNOSIS — Q181 Preauricular sinus and cyst: Secondary | ICD-10-CM

## 2024-01-21 DIAGNOSIS — L0201 Cutaneous abscess of face: Secondary | ICD-10-CM | POA: Diagnosis not present

## 2024-01-21 MED ORDER — LIDOCAINE-EPINEPHRINE 1 %-1:100000 IJ SOLN
INTRAMUSCULAR | Status: AC
  Filled 2024-01-21: qty 1

## 2024-01-21 MED ORDER — DOXYCYCLINE HYCLATE 100 MG PO CAPS: 100.0000 mg | ORAL_CAPSULE | Freq: Two times a day (BID) | ORAL | 0 refills | Status: AC

## 2024-01-21 NOTE — ED Provider Notes (Signed)
 [facility]    CSN: [id number] Arrival date & time: 01/21/24  1347      History   Chief Complaint Chief Complaint  Patient presents with   Abscess    HPI Kristopher Mccall is a 28 y.o. male.   28 year old male who presents to urgent care with complaints of left preauricular pain, swelling and redness.  This started somewhere around 3 to 5 days ago.  It started as a small area that has gotten worse.  He also feels like there is some swelling around his neck.  He denies any difficulty swallowing or trouble breathing except for about "10%" of the time.  He denies any fevers, chills, difficulty swallowing, chest pain, drainage from the area or other associated symptoms.  He has a history of having cyst removed from his earlobes and knew that he had some small areas in the front of his ear but did not have them addressed when the surgeons removed the earlobe cyst as they were not causing any problems.  He has never had any imaging done on his head.    Abscess Associated symptoms: no fever and no vomiting     Past Medical History:  Diagnosis Date   ADHD    Environmental allergies    Infected sebaceous cyst of skin     There are no active problems to display for this patient.   Past Surgical History:  Procedure Laterality Date   CYST EXCISION  2024   Ears   cyst removed from left lung     LUNG LOBECTOMY     half of left lung removed   WISDOM TOOTH EXTRACTION         Home Medications    Prior to Admission medications   Medication Sig Start Date End Date Taking? Authorizing Provider  acetaminophen  (TYLENOL ) 500 MG tablet Take 2 tablets (1,000 mg total) by mouth every 8 (eight) hours as needed. 05/02/20   Darr, Jacob, PA-C  doxycycline  (VIBRAMYCIN ) 100 MG capsule Take 1 capsule (100 mg total) by mouth 2 (two) times daily. 09/24/23   Afton Albright, MD  ibuprofen  (ADVIL ) 600 MG tablet Take 1 tablet (600 mg total) by mouth every 6 (six) hours as needed. 05/02/20   Darr,  Jacob, PA-C  promethazine -dextromethorphan (PROMETHAZINE -DM) 6.25-15 MG/5ML syrup Take 5 mLs by mouth 4 (four) times daily as needed for cough. 09/24/23   Afton Albright, MD    Family History Family History  Problem Relation Age of Onset   Healthy Mother    Healthy Father     Social History Social History   Tobacco Use   Smoking status: Some Days    Types: Cigars   Smokeless tobacco: Never  Vaping Use   Vaping status: Some Days  Substance Use Topics   Alcohol use: Yes    Alcohol/week: 4.0 standard drinks of alcohol    Types: 4 Cans of beer per week   Drug use: Yes    Types: Marijuana     Allergies   Ceftin and Cefuroxime axetil   Review of Systems Review of Systems  Constitutional:  Negative for chills and fever.  HENT:  Negative for ear pain and sore throat.   Eyes:  Negative for pain and visual disturbance.  Respiratory:  Negative for cough and shortness of breath.   Cardiovascular:  Negative for chest pain and palpitations.  Gastrointestinal:  Negative for abdominal pain and vomiting.  Genitourinary:  Negative for dysuria and hematuria.  Musculoskeletal:  Negative for  arthralgias and back pain.  Skin:  Positive for color change (Redness and swelling along the preauricular area on the left). Negative for rash.  Neurological:  Negative for seizures and syncope.  All other systems reviewed and are negative.    Physical Exam Triage Vital Signs ED Triage Vitals  Encounter Vitals Group     BP 01/21/24 1403 132/71     Systolic BP Percentile --      Diastolic BP Percentile --      Pulse Rate 01/21/24 1403 (!) 118     Resp 01/21/24 1403 20     Temp 01/21/24 1403 98.4 F (36.9 C)     Temp Source 01/21/24 1403 Oral     SpO2 01/21/24 1403 100 %     Weight --      Height --      Head Circumference --      Peak Flow --      Pain Score 01/21/24 1401 5     Pain Loc --      Pain Education --      Exclude from Growth Chart --    No data found.  Updated Vital  Signs BP 132/71 (BP Location: Right Arm)   Pulse (!) 118   Temp 98.4 F (36.9 C) (Oral)   Resp 20   SpO2 100%   Visual Acuity Right Eye Distance:   Left Eye Distance:   Bilateral Distance:    Right Eye Near:   Left Eye Near:    Bilateral Near:     Physical Exam Vitals and nursing note reviewed.  Constitutional:      General: He is not in acute distress.    Appearance: He is well-developed.  HENT:     Head: Normocephalic and atraumatic.   Eyes:     Conjunctiva/sclera: Conjunctivae normal.  Cardiovascular:     Rate and Rhythm: Normal rate and regular rhythm.     Heart sounds: No murmur heard. Pulmonary:     Effort: Pulmonary effort is normal. No respiratory distress.     Breath sounds: Normal breath sounds.  Abdominal:     Palpations: Abdomen is soft.     Tenderness: There is no abdominal tenderness.  Musculoskeletal:        General: No swelling.     Cervical back: Neck supple.  Skin:    General: Skin is warm and dry.     Capillary Refill: Capillary refill takes less than 2 seconds.  Neurological:     Mental Status: He is alert.  Psychiatric:        Mood and Affect: Mood normal.     UC Treatments / Results  Labs (all labs ordered are listed, but only abnormal results are displayed) Labs Reviewed - No data to display  EKG   Radiology No results found.  Procedures Incision and Drainage  Date/Time: 01/21/2024 4:07 PM  Performed by: Kreg Pesa, PA-C Authorized by: Kreg Pesa, PA-C   Consent:    Consent obtained:  Verbal   Consent given by:  Patient   Risks, benefits, and alternatives were discussed: yes     Risks discussed:  Bleeding, incomplete drainage, pain and damage to other organs   Alternatives discussed:  No treatment Universal protocol:    Procedure explained and questions answered to patient or proxy's satisfaction: yes     Site/side marked: yes     Immediately prior to procedure, a time out was called: yes     Patient  identity  confirmed:  Verbally with patient Location:    Type:  Abscess   Location:  Head   Head location:  Face (left preauricular) Pre-procedure details:    Skin preparation:  Betadine Anesthesia:    Anesthesia method:  Local infiltration   Local anesthetic:  Lidocaine  1% WITH epi Procedure type:    Complexity:  Simple Procedure details:    Incision types:  Single straight   Incision depth:  Subcutaneous   Wound management:  Probed and deloculated, irrigated with saline and extensive cleaning   Drainage:  Purulent   Drainage amount:  Copious   Wound treatment:  Wound left open   Packing materials:  None Post-procedure details:    Procedure completion:  Tolerated well, no immediate complications  (including critical care time)  Medications Ordered in UC Medications - No data to display  Initial Impression / Assessment and Plan / UC Course  I have reviewed the triage vital signs and the nursing notes.  Pertinent labs & imaging results that were available during my care of the patient were reviewed by me and considered in my medical decision making (see chart for details).     Abscess of preauricular sinus   Incision and drainage done today of the preauricular region.  This is most likely an infected cyst.  Leave the dressing in place today and then may remove tomorrow morning.  Okay to wash the area with soap and water but do not submerge the head in water.  Replace the dressing with a clean dry dressing and change at least once daily but may change twice daily if needed.  Do this until the area is completely healed.  Recommend contacting the surgeon that has removed your cyst in the past to schedule an appointment for further evaluation. Start doxycycline  100 mg twice daily for 10 days.  This is an antibiotic.  Take this with some food. May use Tylenol  for pain. If you feel that the area is worsening, the swelling increases or you have any difficulty with breathing or  swallowing then recommend going to the emergency department for further evaluation.  Final Clinical Impressions(s) / UC Diagnoses   Final diagnoses:  None   Discharge Instructions   None    ED Prescriptions   None    PDMP not reviewed this encounter.   Kreg Pesa, [place] 01/21/24 1608

## 2024-01-21 NOTE — ED Triage Notes (Signed)
 Pt reports abscess under L ear x3 days. Has hx of same requiring I&D. Pt states approx 1 hour ago, he began feeling swelling in his throat and jaw. Reports difficulty swallowing. Able to clear secretions and speak in full sentences. No resp distress noted.

## 2024-01-21 NOTE — Discharge Instructions (Addendum)
 Incision and drainage done today of the preauricular region.  This is most likely an infected cyst.  Leave the dressing in place today and then may remove tomorrow morning.  Okay to wash the area with soap and water but do not submerge the head in water.  Replace the dressing with a clean dry dressing and change at least once daily but may change twice daily if needed.  Do this until the area is completely healed.  Recommend contacting the surgeon that has removed your cyst in the past to schedule an appointment for further evaluation. Start doxycycline  100 mg twice daily for 10 days.  This is an antibiotic.  Take this with some food. May use Tylenol  for pain. If you feel that the area is worsening, the swelling increases or you have any difficulty with breathing or swallowing then recommend going to the emergency department for further evaluation.

## 2024-02-11 ENCOUNTER — Ambulatory Visit (HOSPITAL_COMMUNITY): Payer: Self-pay | Admitting: Physician Assistant

## 2024-02-11 ENCOUNTER — Ambulatory Visit (INDEPENDENT_AMBULATORY_CARE_PROVIDER_SITE_OTHER)

## 2024-02-11 ENCOUNTER — Encounter (HOSPITAL_COMMUNITY): Payer: Self-pay

## 2024-02-11 ENCOUNTER — Ambulatory Visit (HOSPITAL_COMMUNITY)
Admission: EM | Admit: 2024-02-11 | Discharge: 2024-02-11 | Disposition: A | Discharge status: Home / Self Care | Attending: Physician Assistant | Admitting: Physician Assistant

## 2024-02-11 DIAGNOSIS — R051 Acute cough: Secondary | ICD-10-CM

## 2024-02-11 DIAGNOSIS — J189 Pneumonia, unspecified organism: Secondary | ICD-10-CM | POA: Diagnosis not present

## 2024-02-11 DIAGNOSIS — J069 Acute upper respiratory infection, unspecified: Secondary | ICD-10-CM | POA: Diagnosis not present

## 2024-02-11 DIAGNOSIS — R059 Cough, unspecified: Secondary | ICD-10-CM | POA: Diagnosis not present

## 2024-02-11 MED ORDER — ALBUTEROL SULFATE HFA 108 (90 BASE) MCG/ACT IN AERS
INHALATION_SPRAY | RESPIRATORY_TRACT | Status: AC
  Filled 2024-02-11: qty 6.7

## 2024-02-11 MED ORDER — PROMETHAZINE-DM 6.25-15 MG/5ML PO SYRP: 5.0000 mL | ORAL_SOLUTION | Freq: Four times a day (QID) | ORAL | 0 refills | Status: AC | PRN

## 2024-02-11 MED ORDER — ALBUTEROL SULFATE HFA 108 (90 BASE) MCG/ACT IN AERS
2.0000 | INHALATION_SPRAY | Freq: Once | RESPIRATORY_TRACT | Status: AC
  Administered 2024-02-11: 2 via RESPIRATORY_TRACT

## 2024-02-11 NOTE — Discharge Instructions (Signed)
 Your chest x-ray looks the same as it did in January.  I do not see any evidence of pneumonia.  I am waiting for the radiologist to read over me and we will contact you if they see something I did not and we need to change your treatment plan.  Use over-the-counter medication such as Mucinex, Flonase, nasal saline/sinus rinses for symptom relief.  You can use albuterol every 4-6 hours as needed for shortness of breath and chest tightness.  Take the Promethazine  DM for cough.  This will make you sleepy so do not drive or drink alcohol taking it.  If you are not feeling better within a week please return for reevaluation.  If anything worsens and you have worsening cough, shortness of breath, chest pain, weakness, nausea/vomiting interfering with oral intake you need to be seen immediately.

## 2024-02-11 NOTE — ED Provider Notes (Signed)
 [facility]    CSN: [id number] Arrival date & time: 02/11/24  0932      History   Chief Complaint Chief Complaint  Patient presents with   Cough    HPI Kristopher Mccall is a 28 y.o. male.   Patient presents today with a 2 to 3-day history of recurrent cough.  He reports that someone in his household was recently diagnosed with pneumonia and was told that is possibly contagious and so he should be checked.  He has had coughing, wheezing, chest tightness but denies any chest pain, shortness of breath, fever, congestion, nausea, vomiting, diarrhea.  He has taken Promethazine  DM that was leftover from a previous prescription but has not tried additional over-the-counter medication.  He did have pneumonia in January 2025 and was told to come back to have chest x-ray repeated but he never did.  He does report that his symptoms improved with the antibiotics that he was prescribed.  He denies additional antibiotics since that time; he was seen in March for I&D of preauricular cyst and prescribed antibiotics at that time but is between jobs so could not afford them and so never picked them up.  He denies any history of asthma or COPD.  He does smoke.  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    Past Medical History:  Diagnosis Date   ADHD    Environmental allergies    Infected sebaceous cyst of skin     There are no active problems to display for this patient.   Past Surgical History:  Procedure Laterality Date   CYST EXCISION  2024   Ears   cyst removed from left lung     LUNG LOBECTOMY     half of left lung removed   WISDOM TOOTH EXTRACTION         Home Medications    Prior to Admission medications   Medication Sig Start Date End Date Taking? Authorizing Provider  acetaminophen  (TYLENOL ) 500 MG tablet Take 2 tablets (1,000 mg total) by mouth every 8 (eight) hours as needed. 05/02/20   Darr, Jacob, PA-C  ibuprofen  (ADVIL ) 600 MG tablet Take  1 tablet (600 mg total) by mouth every 6 (six) hours as needed. 05/02/20   Darr, Jacob, PA-C  promethazine -dextromethorphan (PROMETHAZINE -DM) 6.25-15 MG/5ML syrup Take 5 mLs by mouth 4 (four) times daily as needed for cough. 02/11/24   Jarel Cuadra, Betsey Brow, PA-C    Family History Family History  Problem Relation Age of Onset   Healthy Mother    Healthy Father     Social History Social History   Tobacco Use   Smoking status: Some Days    Types: Cigars   Smokeless tobacco: Never  Vaping Use   Vaping status: Some Days  Substance Use Topics   Alcohol use: Yes    Alcohol/week: 4.0 standard drinks of alcohol    Types: 4 Cans of beer per week   Drug use: Yes    Types: Marijuana     Allergies   Ceftin and Cefuroxime axetil   Review of Systems Review of Systems  Constitutional:  Positive for activity change. Negative for appetite change, fatigue and fever.  HENT:  Negative for congestion and sore throat.   Respiratory:  Positive for cough, chest tightness and wheezing. Negative for shortness of breath.   Cardiovascular:  Negative for chest pain.  Gastrointestinal:  Negative for abdominal pain, diarrhea, nausea and vomiting.  Physical Exam Triage Vital Signs ED Triage Vitals  Encounter Vitals Group     BP 02/11/24 0955 122/83     Girls Systolic BP Percentile --      Girls Diastolic BP Percentile --      Boys Systolic BP Percentile --      Boys Diastolic BP Percentile --      Pulse Rate 02/11/24 0955 96     Resp 02/11/24 0955 16     Temp 02/11/24 0955 98.3 F (36.8 C)     Temp Source 02/11/24 0955 Oral     SpO2 02/11/24 0955 97 %     Weight --      Height --      Head Circumference --      Peak Flow --      Pain Score 02/11/24 0957 0     Pain Loc --      Pain Education --      Exclude from Growth Chart --    No data found.  Updated Vital Signs BP 122/83 (BP Location: Right Arm)   Pulse 96   Temp 98.3 F (36.8 C) (Oral)   Resp 16   SpO2 97%   Visual  Acuity Right Eye Distance:   Left Eye Distance:   Bilateral Distance:    Right Eye Near:   Left Eye Near:    Bilateral Near:     Physical Exam Vitals reviewed.  Constitutional:      General: He is awake.     Appearance: Normal appearance. He is well-developed. He is not ill-appearing.     Comments: Very pleasant male appears stated age in no acute distress sitting comfortably in exam room  HENT:     Head: Normocephalic and atraumatic.     Right Ear: Tympanic membrane, ear canal and external ear normal. Tympanic membrane is not erythematous or bulging.     Left Ear: Tympanic membrane, ear canal and external ear normal. Tympanic membrane is not erythematous or bulging.     Nose: Nose normal.     Mouth/Throat:     Pharynx: Uvula midline. No oropharyngeal exudate, posterior oropharyngeal erythema or uvula swelling.   Cardiovascular:     Rate and Rhythm: Normal rate and regular rhythm.     Heart sounds: Normal heart sounds, S1 normal and S2 normal. No murmur heard. Pulmonary:     Effort: Pulmonary effort is normal. No accessory muscle usage or respiratory distress.     Breath sounds: No stridor. Examination of the left-lower field reveals decreased breath sounds. Decreased breath sounds present. No wheezing, rhonchi or rales.     Comments: Reactive cough with deep breathing  Neurological:     Mental Status: He is alert.   Psychiatric:        Behavior: Behavior is cooperative.      UC Treatments / Results  Labs (all labs ordered are listed, but only abnormal results are displayed) Labs Reviewed - No data to display  EKG   Radiology No results found.  Procedures Procedures (including critical care time)  Medications Ordered in UC Medications  albuterol (VENTOLIN HFA) 108 (90 Base) MCG/ACT inhaler 2 puff (2 puffs Inhalation Given 02/11/24 1033)    Initial Impression / Assessment and Plan / UC Course  I have reviewed the triage vital signs and the nursing  notes.  Pertinent labs & imaging results that were available during my care of the patient were reviewed by me and considered in my medical decision making (  see chart for details).     Patient is well-appearing, afebrile, nontoxic, nontachycardic.  Viral testing was deferred as he had no fever or congestion and symptoms were primarily cough and wheezing.  Chest x-ray was obtained given his history of pneumonia with persistent cough that showed persistent blunting of costophrenic angle on the left which I suspect is related to previous lobectomy but otherwise no acute cardiopulmonary disease.  At the time of discharge we will waiting for radiologist overread and we will contact him if this differs and changes her treatment plan.  No evidence of acute infection physical exam that warrant initiation of antibiotics.  He was given albuterol in clinic with improvement of symptoms.  He was sent home with this medication with instruction to use this every 4-6 hours as needed.  He was given Promethazine  DM for cough and we discussed that this can be sedating and he is not to drive or drink alcohol while taking it.  If he is not feeling better in a week he is to return for reevaluation.  We discussed that if he has any worsening or changing symptoms including chest pain, shortness of breath, worsening cough, fever, weakness he needs to be seen emergently.  Strict return precautions given.  Excuse note provided.  Final Clinical Impressions(s) / UC Diagnoses   Final diagnoses:  Acute cough  Upper respiratory tract infection, unspecified type     Discharge Instructions      Your chest x-ray looks the same as it did in January.  I do not see any evidence of pneumonia.  I am waiting for the radiologist to read over me and we will contact you if they see something I did not and we need to change your treatment plan.  Use over-the-counter medication such as Mucinex, Flonase, nasal saline/sinus rinses for symptom  relief.  You can use albuterol every 4-6 hours as needed for shortness of breath and chest tightness.  Take the Promethazine  DM for cough.  This will make you sleepy so do not drive or drink alcohol taking it.  If you are not feeling better within a week please return for reevaluation.  If anything worsens and you have worsening cough, shortness of breath, chest pain, weakness, nausea/vomiting interfering with oral intake you need to be seen immediately.     ED Prescriptions     Medication Sig Dispense Auth. Provider   promethazine -dextromethorphan (PROMETHAZINE -DM) 6.25-15 MG/5ML syrup Take 5 mLs by mouth 4 (four) times daily as needed for cough. 118 mL Haeli Gerlich K, PA-C      PDMP not reviewed this encounter.   Budd Cargo, PA-C 02/11/24 1117

## 2024-02-11 NOTE — ED Triage Notes (Signed)
 Pt state someone in  his house has pneumonia and he wants to be checked. States he has been coughing.
